# Patient Record
Sex: Female | Born: 1980 | Race: Black or African American | Hispanic: No | Marital: Married | Smoking: Current every day smoker
Health system: Southern US, Community
[De-identification: ages and names within clinical notes are randomized; demographics above are authoritative.]

## PROBLEM LIST (undated history)

## (undated) DIAGNOSIS — I10 Essential (primary) hypertension: Secondary | ICD-10-CM

## (undated) DIAGNOSIS — I82409 Acute embolism and thrombosis of unspecified deep veins of unspecified lower extremity: Secondary | ICD-10-CM

## (undated) HISTORY — PX: OTHER SURGICAL HISTORY: SHX169

---

## 2011-05-27 ENCOUNTER — Emergency Department (HOSPITAL_COMMUNITY)
Admission: EM | Admit: 2011-05-27 | Discharge: 2011-05-27 | Disposition: A | Discharge status: Home / Self Care | Payer: BC Managed Care – PPO | Attending: Emergency Medicine | Admitting: Emergency Medicine

## 2011-05-27 DIAGNOSIS — I10 Essential (primary) hypertension: Secondary | ICD-10-CM | POA: Insufficient documentation

## 2011-05-27 DIAGNOSIS — G43909 Migraine, unspecified, not intractable, without status migrainosus: Secondary | ICD-10-CM | POA: Insufficient documentation

## 2011-12-30 ENCOUNTER — Emergency Department (HOSPITAL_COMMUNITY)
Admission: EM | Admit: 2011-12-30 | Discharge: 2011-12-30 | Disposition: A | Discharge status: Home / Self Care | Payer: BC Managed Care – PPO | Attending: Emergency Medicine | Admitting: Emergency Medicine

## 2011-12-30 ENCOUNTER — Encounter (HOSPITAL_COMMUNITY): Payer: Self-pay | Admitting: Emergency Medicine

## 2011-12-30 DIAGNOSIS — I10 Essential (primary) hypertension: Secondary | ICD-10-CM | POA: Insufficient documentation

## 2011-12-30 DIAGNOSIS — R51 Headache: Secondary | ICD-10-CM | POA: Insufficient documentation

## 2011-12-30 HISTORY — DX: Essential (primary) hypertension: I10

## 2011-12-30 MED ORDER — METOCLOPRAMIDE HCL 5 MG/ML IJ SOLN
10.0000 mg | Freq: Once | INTRAMUSCULAR | Status: AC
  Administered 2011-12-30: 10 mg via INTRAVENOUS
  Filled 2011-12-30: qty 2

## 2011-12-30 MED ORDER — KETOROLAC TROMETHAMINE 30 MG/ML IJ SOLN
30.0000 mg | Freq: Once | INTRAMUSCULAR | Status: AC
  Administered 2011-12-30: 30 mg via INTRAMUSCULAR
  Filled 2011-12-30: qty 1

## 2011-12-30 NOTE — ED Notes (Signed)
Pt states she has a headache she cannot get rid of for the past 3 days  Pt describes as a nagging pain  Pt has nausea without vomiting  No hx of migraines but does have a history of HTN

## 2011-12-30 NOTE — ED Provider Notes (Signed)
Medical screening examination/treatment/procedure(s) were performed by non-physician practitioner and as supervising physician I was immediately available for consultation/collaboration.   Lyanne Co, MD 12/30/11 [phone]
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
## 2011-12-30 NOTE — Discharge Instructions (Signed)
General Headache, Without Cause A general headache has no specific cause. These headaches are not life-threatening. They will not lead to other types of headaches. HOME CARE   Make and keep follow-up visits with your doctor.   Only take medicine as told by your doctor.   Try to relax, get a massage, or use your thoughts to control your body (biofeedback).   Apply cold or heat to the head and neck. Apply 3 or 4 times a day or as needed.  Finding out the results of your test Ask when your test results will be ready. Make sure you get your test results. GET HELP RIGHT AWAY IF:   You have problems with medicine.   Your medicine does not help relieve pain.   Your headache changes or becomes worse.   You feel sick to your stomach (nauseous) or throw up (vomit).   You have a temperature by mouth above 102 F (38.9 C), not controlled by medicine.   Your have a stiff neck.   You have vision loss.   You have muscle weakness.   You lose control of your muscles.   You lose balance or have trouble walking.   You feel like you are going to pass out (faint).  MAKE SURE YOU:   Understand these instructions.   Will watch this condition.   Will get help right away if you are not doing well or get worse.  Document Released: 04/27/2008 Document Revised: 07/08/2011 Document Reviewed: 04/27/2008 [facility] Patient Information 2012 [place], [place]. Try to stay hydrated, and this season that can cause headache began years old.  Doses of ibuprofen or Tylenol for any residual headache, but at this time.  The headache has totally resolved

## 2012-01-03 ENCOUNTER — Other Ambulatory Visit: Payer: Self-pay | Admitting: Emergency Medicine

## 2012-01-03 DIAGNOSIS — N63 Unspecified lump in unspecified breast: Secondary | ICD-10-CM

## 2012-01-05 ENCOUNTER — Other Ambulatory Visit: Payer: BC Managed Care – PPO

## 2012-05-08 ENCOUNTER — Emergency Department (HOSPITAL_COMMUNITY)
Admission: EM | Admit: 2012-05-08 | Discharge: 2012-05-09 | Disposition: A | Discharge status: Home / Self Care | Payer: BC Managed Care – PPO | Attending: Emergency Medicine | Admitting: Emergency Medicine

## 2012-05-08 DIAGNOSIS — J45909 Unspecified asthma, uncomplicated: Secondary | ICD-10-CM | POA: Insufficient documentation

## 2012-05-08 DIAGNOSIS — R109 Unspecified abdominal pain: Secondary | ICD-10-CM | POA: Insufficient documentation

## 2012-05-08 DIAGNOSIS — Z882 Allergy status to sulfonamides status: Secondary | ICD-10-CM | POA: Insufficient documentation

## 2012-05-08 DIAGNOSIS — I10 Essential (primary) hypertension: Secondary | ICD-10-CM | POA: Insufficient documentation

## 2012-05-08 DIAGNOSIS — Z8249 Family history of ischemic heart disease and other diseases of the circulatory system: Secondary | ICD-10-CM | POA: Insufficient documentation

## 2012-05-08 DIAGNOSIS — Z809 Family history of malignant neoplasm, unspecified: Secondary | ICD-10-CM | POA: Insufficient documentation

## 2012-05-08 LAB — COMPREHENSIVE METABOLIC PANEL
ALT: 21 U/L (ref 0–35)
Calcium: 9.3 mg/dL (ref 8.4–10.5)
Creatinine, Ser: 0.83 mg/dL (ref 0.50–1.10)
GFR calc Af Amer: >90 mL/min (ref >90)
Glucose, Bld: 100 mg/dL — ABNORMAL HIGH (ref 70–99)
Sodium: 139 mEq/L (ref 135–145)
Total Protein: 6.9 g/dL (ref 6.0–8.3)

## 2012-05-08 LAB — CBC WITH DIFFERENTIAL/PLATELET
Basophils Absolute: 0 10*3/uL (ref 0.0–0.1)
Eosinophils Absolute: 0.3 10*3/uL (ref 0.0–0.7)
Eosinophils Relative: 4 % (ref 0–5)
Lymphs Abs: 3.7 10*3/uL (ref 0.7–4.0)
MCH: 31.5 pg (ref 26.0–34.0)
MCV: 91.7 fL (ref 78.0–100.0)
Monocytes Absolute: 0.3 10*3/uL (ref 0.1–1.0)
Platelets: 296 10*3/uL (ref 150–400)
RDW: 12.7 % (ref 11.5–15.5)

## 2012-05-08 LAB — URINALYSIS, ROUTINE W REFLEX MICROSCOPIC
Bilirubin Urine: NEGATIVE
Leukocytes, UA: NEGATIVE
Nitrite: NEGATIVE
Specific Gravity, Urine: 1.024 (ref 1.005–1.030)
Urobilinogen, UA: 0.2 mg/dL (ref 0.0–1.0)
pH: 5.5 (ref 5.0–8.0)

## 2012-05-08 NOTE — ED Notes (Signed)
Pt c/o abd pain that started at yesterday. Pt was seen today a urget care ([facility] [address]). Pt has N/V.pt sent for r/o appendicitis.VSS

## 2012-05-09 LAB — WET PREP, GENITAL
Trich, Wet Prep: NONE SEEN
Yeast Wet Prep HPF POC: NONE SEEN

## 2012-05-09 NOTE — ED Provider Notes (Signed)
History     CSN: [id number]  Arrival date & time 05/08/12  2014   First MD Initiated Contact with Patient 05/09/12 0046      Chief Complaint  Patient presents with  . Abdominal Pain    (Consider location/radiation/quality/duration/timing/severity/associated sxs/prior treatment) Patient is a 31 y.o. female presenting with abdominal pain. The history is provided by the patient.  Abdominal Pain The primary symptoms of the illness include abdominal pain and nausea. The primary symptoms of the illness do not include fever, shortness of breath, diarrhea, dysuria, vaginal discharge or vaginal bleeding.  Associated symptoms comments: Yesterday having lower abdominal pain associated with nausea and vomiting. No fever. She was eating yesterday without difficulty. Denies urinary symptoms, vaginal discharge or abnormal bleeding. She was seen at Urgent Care and sent here for further evaluation. .    Past Medical History  Diagnosis Date  . Hypertension   . Asthma     No past surgical history on file.  Family History  Problem Relation Age of Onset  . Cancer Mother   . Hypertension Father     History  Substance Use Topics  . Smoking status: Never Smoker   . Smokeless tobacco: Not on file  . Alcohol Use: No    OB History    Grav Para Term Preterm Abortions TAB SAB Ect Mult Living                  Review of Systems  Constitutional: Negative for fever.  Respiratory: Negative for shortness of breath.   Cardiovascular: Negative for chest pain.  Gastrointestinal: Positive for nausea and abdominal pain. Negative for diarrhea.  Genitourinary: Negative for dysuria, vaginal bleeding and vaginal discharge.    Allergies  Sulfa antibiotics  Home Medications   Current Outpatient Rx  Name Route Sig Dispense Refill  . ACETAMINOPHEN 500 MG PO TABS Oral Take 500 mg by mouth every 6 (six) hours as needed. Pain.    Marland Kitchen AMLODIPINE BESYLATE 10 MG PO TABS Oral Take 10 mg by mouth daily.       BP 136/79  Pulse 53  Temp 97.9 F (36.6 C) (Oral)  Resp 16  SpO2 99%  LMP 04/18/2012  Physical Exam  Constitutional: She is oriented to person, place, and time. She appears well-developed and well-nourished.  Neck: Normal range of motion.  Pulmonary/Chest: Effort normal.  Abdominal: Soft. Bowel sounds are normal. There is no rebound and no guarding.       Mild tenderness to lower abdomen bilaterally. No rebounding or guarding.   Genitourinary:       No cervical tenderness or discharge. No adnexal mass. Mild right adnexal tenderness.   Neurological: She is alert and oriented to person, place, and time.  Skin: Skin is warm and dry.  Psychiatric: She has a normal mood and affect.    ED Course  Procedures (including critical care time)  Labs Reviewed  CBC WITH DIFFERENTIAL - Abnormal; Notable for the following:    Neutrophils Relative 41 (*)     Lymphocytes Relative 51 (*)     All other components within normal limits  COMPREHENSIVE METABOLIC PANEL - Abnormal; Notable for the following:    Glucose, Bld 100 (*)     Total Bilirubin 0.2 (*)     All other components within normal limits  URINALYSIS, ROUTINE W REFLEX MICROSCOPIC - Abnormal; Notable for the following:    APPearance CLOUDY (*)     All other components within normal limits  WET PREP, GENITAL  GC/CHLAMYDIA PROBE AMP, GENITAL   No results found.   No diagnosis found. 1. Abdominal pain   MDM  The patient reports she is feeling much better without intervention. No further nausea. She has no fever, is maintaining normal diet, has no leukocytosis and an exam with bilateral tenderness. DDx appy vs. Ovarian cyst. Doubt appendicitis. Discussed findings, exam, the condition of appendicitis with the patient. She states that her husband needs to go to work and she would rather not stay for a CT scan. She agrees to return in 24 hours for a re-examination and understands the risk.        Rodena Medin,  PA-C 05/09/12 [phone]

## 2012-05-09 NOTE — ED Provider Notes (Signed)
Medical screening examination/treatment/procedure(s) were performed by non-physician practitioner and as supervising physician I was immediately available for consultation/collaboration.  Toy Baker, MD 05/09/12 [phone]

## 2012-10-02 ENCOUNTER — Emergency Department (HOSPITAL_COMMUNITY)
Admission: EM | Admit: 2012-10-02 | Discharge: 2012-10-02 | Disposition: A | Discharge status: Home / Self Care | Payer: BC Managed Care – PPO | Attending: Emergency Medicine | Admitting: Emergency Medicine

## 2012-10-02 ENCOUNTER — Encounter (HOSPITAL_COMMUNITY): Payer: Self-pay | Admitting: *Deleted

## 2012-10-02 DIAGNOSIS — M62838 Other muscle spasm: Secondary | ICD-10-CM | POA: Insufficient documentation

## 2012-10-02 DIAGNOSIS — I10 Essential (primary) hypertension: Secondary | ICD-10-CM | POA: Insufficient documentation

## 2012-10-02 DIAGNOSIS — X503XXA Overexertion from repetitive movements, initial encounter: Secondary | ICD-10-CM | POA: Insufficient documentation

## 2012-10-02 DIAGNOSIS — Y9389 Activity, other specified: Secondary | ICD-10-CM | POA: Insufficient documentation

## 2012-10-02 DIAGNOSIS — J45909 Unspecified asthma, uncomplicated: Secondary | ICD-10-CM | POA: Insufficient documentation

## 2012-10-02 DIAGNOSIS — Y929 Unspecified place or not applicable: Secondary | ICD-10-CM | POA: Insufficient documentation

## 2012-10-02 DIAGNOSIS — Z79899 Other long term (current) drug therapy: Secondary | ICD-10-CM | POA: Insufficient documentation

## 2012-10-02 DIAGNOSIS — IMO0002 Reserved for concepts with insufficient information to code with codable children: Secondary | ICD-10-CM | POA: Insufficient documentation

## 2012-10-02 DIAGNOSIS — M545 Low back pain: Secondary | ICD-10-CM

## 2012-10-02 MED ORDER — HYDROCODONE-ACETAMINOPHEN 5-325 MG PO TABS: 1.0000 | ORAL_TABLET | ORAL | Status: DC | PRN

## 2012-10-02 MED ORDER — CYCLOBENZAPRINE HCL 10 MG PO TABS
10.0000 mg | ORAL_TABLET | Freq: Once | ORAL | Status: AC
  Administered 2012-10-02: 10 mg via ORAL
  Filled 2012-10-02: qty 1

## 2012-10-02 MED ORDER — IBUPROFEN 800 MG PO TABS
800.0000 mg | ORAL_TABLET | Freq: Once | ORAL | Status: AC
  Administered 2012-10-02: 800 mg via ORAL
  Filled 2012-10-02: qty 1

## 2012-10-02 MED ORDER — IBUPROFEN 800 MG PO TABS: 800.0000 mg | ORAL_TABLET | Freq: Three times a day (TID) | ORAL | Status: DC

## 2012-10-02 MED ORDER — HYDROCODONE-ACETAMINOPHEN 5-325 MG PO TABS
1.0000 | ORAL_TABLET | Freq: Once | ORAL | Status: AC
  Administered 2012-10-02: 1 via ORAL
  Filled 2012-10-02: qty 1

## 2012-10-02 MED ORDER — CYCLOBENZAPRINE HCL 10 MG PO TABS: 10.0000 mg | ORAL_TABLET | Freq: Three times a day (TID) | ORAL | Status: DC | PRN

## 2012-10-02 NOTE — ED Provider Notes (Signed)
History    This chart was scribed for non-physician practitioner working with Lyanne Co, MD by Charolett Bumpers, ED Scribe. This patient was seen in room WTR8/WTR8 and the patient's care was started at 1517.   CSN: [id number]  Arrival date & time 10/02/12  1353   First MD Initiated Contact with Patient 10/02/12 1517      Chief Complaint  Patient presents with  . Back Pain  . Spasms    The history is provided by the patient. No language interpreter was used.   Patricia Gordon is a 32 y.o. female who presents to the Emergency Department complaining of persistent, moderate left lower back pain that radiates to her left leg with associated back spasms. She states that she has had mild back pain for a month after lifting heavy objects at work but no specific injury. She states that the pain worsened 2 days ago and describes the pain as sharp, grabbing pain that eases off. She denies any numbness, tingling, bowel or urinary incontinence, fevers, abdominal pain, difficulty urinating. She denies any h/o back pain.   Past Medical History  Diagnosis Date  . Hypertension   . Asthma     Past Surgical History  Procedure Laterality Date  . Boil removed      Family History  Problem Relation Age of Onset  . Cancer Mother   . Hypertension Father     History  Substance Use Topics  . Smoking status: Never Smoker   . Smokeless tobacco: Not on file  . Alcohol Use: No    OB History   Grav Para Term Preterm Abortions TAB SAB Ect Mult Living                  Review of Systems A complete 10 system review of systems was obtained and all systems are negative except as noted in the HPI and PMH.   Allergies  Sulfa antibiotics  Home Medications   Current Outpatient Rx  Name  Route  Sig  Dispense  Refill  . acetaminophen (TYLENOL) 500 MG tablet   Oral   Take 500 mg by mouth every 6 (six) hours as needed. Pain.         Marland Kitchen amLODipine (NORVASC) 10 MG tablet   Oral   Take 10  mg by mouth daily.         . lansoprazole (PREVACID) 30 MG capsule   Oral   Take 30 mg by mouth daily.           BP 125/82  Pulse 63  Temp(Src) 98.2 F (36.8 C) (Oral)  Resp 18  SpO2 100%  LMP 09/25/2012  Physical Exam  Nursing note and vitals reviewed. Constitutional: She is oriented to person, place, and time. She appears well-developed and well-nourished. No distress.  HENT:  Head: Normocephalic and atraumatic.  Eyes: EOM are normal. Pupils are equal, round, and reactive to light.  Neck: Normal range of motion. Neck supple. No tracheal deviation present.  Cardiovascular: Normal rate.   Pulmonary/Chest: Effort normal. No respiratory distress.  Abdominal: Soft. She exhibits no distension.  Musculoskeletal: Normal range of motion. She exhibits tenderness. She exhibits no edema.  Tender across left para lumbar spine. No midline lumbar tenderness. No swelling or discoloration lower back. Ambulatory with full weight bearing ability.  Neurological: She is alert and oriented to person, place, and time.  Skin: Skin is warm and dry.  Psychiatric: She has a normal mood and affect. Her behavior is  normal.    ED Course  Procedures (including critical care time)  DIAGNOSTIC STUDIES: Oxygen Saturation is 100% on room air, normal by my interpretation.    COORDINATION OF CARE:  15:36-Discussed planned course of treatment with the patient including rest and anti-inflammatories, who is agreeable at this time. Advised to f/u with orthopedic if pain persists.    Labs Reviewed - No data to display No results found.   No diagnosis found. 1. Low back pain 2. Muscle spasm   MDM  Uncomplicated muscular spasm without neurologic deficit.   I personally performed the services described in this documentation, which was scribed in my presence. The recorded information has been reviewed and is accurate.         Arnoldo Hooker, PA-C 10/02/12 1542

## 2012-10-02 NOTE — ED Notes (Signed)
Pt states yesterday started having lower back pain, w/ spasms, radiating down L leg.

## 2012-10-02 NOTE — ED Provider Notes (Signed)
Medical screening examination/treatment/procedure(s) were performed by non-physician practitioner and as supervising physician I was immediately available for consultation/collaboration.   Lyanne Co, MD 10/02/12 [phone]

## 2012-10-02 NOTE — ED Notes (Signed)
Pt has a ride home.  

## 2013-02-12 ENCOUNTER — Telehealth (INDEPENDENT_AMBULATORY_CARE_PROVIDER_SITE_OTHER): Payer: Self-pay

## 2013-02-12 ENCOUNTER — Other Ambulatory Visit (INDEPENDENT_AMBULATORY_CARE_PROVIDER_SITE_OTHER): Payer: Self-pay | Admitting: *Deleted

## 2013-02-12 ENCOUNTER — Encounter (HOSPITAL_COMMUNITY): Payer: Self-pay | Admitting: *Deleted

## 2013-02-12 ENCOUNTER — Emergency Department (HOSPITAL_COMMUNITY)
Admission: EM | Admit: 2013-02-12 | Discharge: 2013-02-12 | Disposition: A | Discharge status: Home / Self Care | Payer: BC Managed Care – PPO | Attending: Emergency Medicine | Admitting: Emergency Medicine

## 2013-02-12 ENCOUNTER — Other Ambulatory Visit (INDEPENDENT_AMBULATORY_CARE_PROVIDER_SITE_OTHER): Payer: Self-pay | Admitting: General Surgery

## 2013-02-12 DIAGNOSIS — N644 Mastodynia: Secondary | ICD-10-CM

## 2013-02-12 DIAGNOSIS — J45909 Unspecified asthma, uncomplicated: Secondary | ICD-10-CM | POA: Insufficient documentation

## 2013-02-12 DIAGNOSIS — Z79899 Other long term (current) drug therapy: Secondary | ICD-10-CM | POA: Insufficient documentation

## 2013-02-12 DIAGNOSIS — Z872 Personal history of diseases of the skin and subcutaneous tissue: Secondary | ICD-10-CM | POA: Insufficient documentation

## 2013-02-12 DIAGNOSIS — I10 Essential (primary) hypertension: Secondary | ICD-10-CM | POA: Insufficient documentation

## 2013-02-12 DIAGNOSIS — N611 Abscess of the breast and nipple: Secondary | ICD-10-CM

## 2013-02-12 DIAGNOSIS — N61 Mastitis without abscess: Secondary | ICD-10-CM | POA: Insufficient documentation

## 2013-02-12 DIAGNOSIS — F17200 Nicotine dependence, unspecified, uncomplicated: Secondary | ICD-10-CM | POA: Insufficient documentation

## 2013-02-12 MED ORDER — OXYCODONE-ACETAMINOPHEN 5-325 MG PO TABS: 1.0000 | ORAL_TABLET | ORAL | Status: DC | PRN

## 2013-02-12 MED ORDER — DOXYCYCLINE HYCLATE 100 MG PO CAPS: 100.0000 mg | ORAL_CAPSULE | Freq: Two times a day (BID) | ORAL | Status: DC

## 2013-02-12 MED ORDER — OXYCODONE-ACETAMINOPHEN 5-325 MG PO TABS
1.0000 | ORAL_TABLET | Freq: Once | ORAL | Status: AC
  Administered 2013-02-12: 1 via ORAL
  Filled 2013-02-12: qty 1

## 2013-02-12 MED ORDER — FLUCONAZOLE 150 MG PO TABS: 150.0000 mg | ORAL_TABLET | Freq: Once | ORAL | Status: DC

## 2013-02-12 MED ORDER — ONDANSETRON 4 MG PO TBDP
8.0000 mg | ORAL_TABLET | Freq: Once | ORAL | Status: AC
  Administered 2013-02-12: 8 mg via ORAL
  Filled 2013-02-12: qty 2

## 2013-02-12 NOTE — ED Notes (Signed)
Pt has abscess recurrent that is in her right nipple for a week. Pt states that they normally have to I&D it through consult to surgery. Pt states very painful to touch.

## 2013-02-12 NOTE — Consult Note (Signed)
Patricia Gordon [birth year]  [id number].   Primary Care MD: none Requesting MD: Dr. Brandt Loosen Chief Complaint/Reason for Consult: right breast pain HPI: this is a 32 yo black female who just recently moved here from [facility].  She has a history of having 3 prior abscesses on her right breast in the same spot over the last 1 year.  She has been followed by a surgeon in [facility] who has does several I&Ds and what sounds like a local excision of the inflamed area.  About a week ago, she developed some discomfort in this same spot similar to prior episodes.  She denies fevers or drainage at home.  She presented to the [facility] today for further evaluation.  She denies any trauma or injections into her breast.  We have been asked to see for further evaluation.  Review of Systems: Please see HPI, otherwise all other systems have been reviewed and are negative.  Family History  Problem Relation Age of Onset  . Cancer Mother   . Hypertension Father     Past Medical History  Diagnosis Date  . Hypertension   . Asthma     Past Surgical History  Procedure Laterality Date  . Boil removed      Social History:  reports that she has been smoking.  She does not have any smokeless tobacco history on file. She reports that she does not drink alcohol or use illicit drugs.  Allergies:  Allergies  Allergen Reactions  . Sulfa Antibiotics Nausea And Vomiting     (Not in a hospital admission)  Blood pressure 159/96, temperature 98.1 F (36.7 C), temperature source Oral, resp. rate 18, SpO2 99.00%. Physical Exam: General: pleasant, obese black female who is laying in bed in NAD HEENT: head is normocephalic, atraumatic.  Sclera are noninjected.  PERRL.  Ears and nose without any masses or lesions.  Mouth is pink and moist Heart: regular, rate, and rhythm.  Normal s1,s2. No obvious murmurs, gallops, or rubs noted.  Palpable radial and pedal pulses bilaterally Lungs: CTAB, no wheezes, rhonchi, or rales noted.   Respiratory effort nonlabored Breast: right breast with no obvious erythema, some slight warmth on the medical aspect of the areola.  No definite fluctuance or abscess visualized.  She does have some slight swelling of the medial portion of her nipple.  No fluid or drainage is able to be expressed.  This area is minimally tender.  She does have a scar present from around 2-4 oclock with some indentation from prior surgery.  Left breast with no abnormalities Abd: soft, NT, ND, +BS, no masses, hernias, or organomegaly Skin: warm and dry with no masses, lesions, or rashes Psych: A&Ox3 with an appropriate affect.    No results found for this or any previous visit (from the past 48 hour(s)). No results found.     Assessment/Plan 1. Right breast pain 2. H/o right breast abscesses x 3 3. HTN 4. Asthma  Plan: 1. I have d/w Dr. Carolynne Edouard.  The patient does not appear ill.  She is afebrile.  There is no definite sign of obvious abscess; however, she has a large amount of breast tissue and could have something deeper that is unable to be palpated.  We will arrange for outpatient imaging at the breast center for better visualization.  In the meantime, we will recommended 10 days of doxycycline along with warm compresses.  She is to call us or return to the ED if this gets any worse or she develops  fevers.  This has all been explained to the patient and she understands.  Patricia Gordon E 02/12/2013, 8:29 AM Pager: [phone]

## 2013-02-12 NOTE — ED Notes (Signed)
Surgical PA at bedside  

## 2013-02-12 NOTE — ED Provider Notes (Signed)
   History    CSN: [id number] Arrival date & time 02/12/13  0544  First MD Initiated Contact with Patient 02/12/13 [phone]     Chief Complaint  Patient presents with  . Abscess   (Consider location/radiation/quality/duration/timing/severity/associated sxs/prior Treatment) HPI  Patient is a generally healthy 32 yo woman with a history of recurrent abscesses of the right nipple. The patient has had 7 days of pain in the right nipple. She feels like she has a recurrent abscess. Her pain as 10 over 10, she is unable to describe the nature of the pain. The area is exquisitely tender. She denies fever. She denies pain over other breast tissue. Patient is not diabetic.  The patient had been followed by a general surgeon in [place]. However, she recently relocated to this area. Past Medical History  Diagnosis Date  . Hypertension   . Asthma    Past Surgical History  Procedure Laterality Date  . Boil removed     Family History  Problem Relation Age of Onset  . Cancer Mother   . Hypertension Father    History  Substance Use Topics  . Smoking status: Current Every Day Smoker  . Smokeless tobacco: Not on file  . Alcohol Use: No   OB History   Grav Para Term Preterm Abortions TAB SAB Ect Mult Living                 Review of Systems Gen: no weight loss, fevers, chills, night sweats Eyes: no discharge or drainage, no occular pain or visual changes Nose: no epistaxis or rhinorrhea Mouth: no dental pain, no sore throat Neck: no neck pain Lungs: no SOB, cough, wheezing CV: no chest pain, palpitations, dependent edema or orthopnea Abd: no abdominal pain, nausea, vomiting GU: no dysuria or gross hematuria MSK: no myalgias or arthralgias Neuro: no headache, no focal neurologic deficits Skin: no rash Psyche: negative.  Allergies  Sulfa antibiotics  Home Medications   Current Outpatient Rx  Name  Route  Sig  Dispense  Refill  . amLODipine (NORVASC) 10 MG tablet   Oral  Take 10 mg by mouth daily.          BP 159/96  Temp(Src) 98.1 F (36.7 C) (Oral)  Resp 18  SpO2 99% Physical Exam Gen: well developed and well nourished appearing Head: NCAT Eyes: PERL, EOMI Nose: no epistaixis or rhinorrhea Mouth/throat: mucosa is moist and pink Neck: supple, no stridor Lungs: CTA B, no wheezing, rhonchi or rales CV: RRR, no murmur Breasts: small abscess with fluctuance and exquisite ttp over the medial aspect of the right nipple, no extension of infectious/inflammatory process into the surrounding breast tissue is appreciated, no nipple, discharge, the nipple is not inverted, no axillary LAN, left breast and nipple are normal to inspection.  Abd: soft, obese, notender, nondistended Back: no ttp, no cva ttp Skin: no rashese, wnl Neuro: CN ii-xii grossly intact, no focal deficits Psyche; normal affect,  calm and cooperative.   ED Course  Procedures (including critical care time)  Case discussed with Dr. Jayme Cloud  MDM  Patient with right nipple abscess. Dr. Jayme Cloud of [facility] consulted and his team will consult on the patient in the ED.   Patient has been seen by [facility] PA. Recommendation is for doxycyline 100mg  po bid x 10d with analgesic. [facility] to arrange follow up a the [facility]. Patient counseled to return to the ED for worsening sx.   Brandt Loosen, MD 02/12/13 0830

## 2013-02-12 NOTE — ED Notes (Signed)
Patient states she has abscess developing x 1 week on right breast, denies drainage

## 2013-02-12 NOTE — ED Notes (Signed)
Pt alert and mentating appropriately upon d/c. Pt given d/c teaching, prescription, and follow up care instructions. Pt verbalizes understanding of d/c teaching and has no further questions upon d/c. Pt ambulatory upon d/c. Pt instructed not to drive. Pt endorses that she will not be driving home and ride is waiting for her. NAD noted upon d/c.

## 2013-02-12 NOTE — Telephone Encounter (Signed)
Called patient phone number has been disconnected, called patient emergency contact phone number which is also incorrect.  RE:  [facility] can see patient 02/13/13 @ 8:00 am.  Abigail Butts @ [phone] ext [phone] w/[facility] made her aware that we are unable to contact patient.

## 2013-02-15 ENCOUNTER — Telehealth (INDEPENDENT_AMBULATORY_CARE_PROVIDER_SITE_OTHER): Payer: Self-pay | Admitting: General Surgery

## 2013-02-15 NOTE — Telephone Encounter (Signed)
LMOM letting pt know that her [employer]GM and [place] were scheduled for 02/21/13 at 10:40.

## 2013-02-21 ENCOUNTER — Ambulatory Visit
Admission: RE | Admit: 2013-02-21 | Discharge: 2013-02-21 | Disposition: A | Discharge status: Home / Self Care | Payer: BC Managed Care – PPO | Source: Ambulatory Visit | Attending: General Surgery | Admitting: General Surgery

## 2013-02-21 DIAGNOSIS — N611 Abscess of the breast and nipple: Secondary | ICD-10-CM

## 2013-09-15 ENCOUNTER — Emergency Department (HOSPITAL_COMMUNITY): Payer: BC Managed Care – PPO

## 2013-09-15 ENCOUNTER — Encounter (HOSPITAL_COMMUNITY): Payer: Self-pay | Admitting: Emergency Medicine

## 2013-09-15 ENCOUNTER — Emergency Department (HOSPITAL_COMMUNITY)
Admission: EM | Admit: 2013-09-15 | Discharge: 2013-09-15 | Disposition: A | Discharge status: Home / Self Care | Payer: BC Managed Care – PPO | Attending: Emergency Medicine | Admitting: Emergency Medicine

## 2013-09-15 DIAGNOSIS — R0789 Other chest pain: Secondary | ICD-10-CM | POA: Insufficient documentation

## 2013-09-15 DIAGNOSIS — Z79899 Other long term (current) drug therapy: Secondary | ICD-10-CM | POA: Insufficient documentation

## 2013-09-15 DIAGNOSIS — IMO0002 Reserved for concepts with insufficient information to code with codable children: Secondary | ICD-10-CM | POA: Insufficient documentation

## 2013-09-15 DIAGNOSIS — F17200 Nicotine dependence, unspecified, uncomplicated: Secondary | ICD-10-CM | POA: Insufficient documentation

## 2013-09-15 DIAGNOSIS — I10 Essential (primary) hypertension: Secondary | ICD-10-CM | POA: Insufficient documentation

## 2013-09-15 DIAGNOSIS — Z3202 Encounter for pregnancy test, result negative: Secondary | ICD-10-CM | POA: Insufficient documentation

## 2013-09-15 DIAGNOSIS — R079 Chest pain, unspecified: Secondary | ICD-10-CM

## 2013-09-15 DIAGNOSIS — Z792 Long term (current) use of antibiotics: Secondary | ICD-10-CM | POA: Insufficient documentation

## 2013-09-15 DIAGNOSIS — J45909 Unspecified asthma, uncomplicated: Secondary | ICD-10-CM | POA: Insufficient documentation

## 2013-09-15 DIAGNOSIS — Z86718 Personal history of other venous thrombosis and embolism: Secondary | ICD-10-CM | POA: Insufficient documentation

## 2013-09-15 HISTORY — DX: Acute embolism and thrombosis of unspecified deep veins of unspecified lower extremity: I82.409

## 2013-09-15 LAB — CBC
HCT: 40.8 % (ref 36.0–46.0)
Hemoglobin: 14.3 g/dL (ref 12.0–15.0)
MCH: 33.2 pg (ref 26.0–34.0)
MCHC: 35 g/dL (ref 30.0–36.0)
MCV: 94.7 fL (ref 78.0–100.0)
PLATELETS: 287 10*3/uL (ref 150–400)
RBC: 4.31 MIL/uL (ref 3.87–5.11)
RDW: 12.9 % (ref 11.5–15.5)
WBC: 5.9 10*3/uL (ref 4.0–10.5)

## 2013-09-15 LAB — COMPREHENSIVE METABOLIC PANEL
ALBUMIN: 3.8 g/dL (ref 3.5–5.2)
ALT: 23 U/L (ref 0–35)
AST: 18 U/L (ref 0–37)
Alkaline Phosphatase: 59 U/L (ref 39–117)
BUN: 8 mg/dL (ref 6–23)
CALCIUM: 9.1 mg/dL (ref 8.4–10.5)
CO2: 25 mEq/L (ref 19–32)
CREATININE: 0.82 mg/dL (ref 0.50–1.10)
Chloride: 104 mEq/L (ref 96–112)
GFR calc Af Amer: >90 mL/min (ref >90)
GFR calc non Af Amer: >90 mL/min (ref >90)
Glucose, Bld: 83 mg/dL (ref 70–99)
Potassium: 4.2 mEq/L (ref 3.7–5.3)
Sodium: 140 mEq/L (ref 137–147)
TOTAL PROTEIN: 7.1 g/dL (ref 6.0–8.3)
Total Bilirubin: 0.2 mg/dL — ABNORMAL LOW (ref 0.3–1.2)

## 2013-09-15 LAB — POCT PREGNANCY, URINE: Preg Test, Ur: NEGATIVE

## 2013-09-15 LAB — POCT I-STAT TROPONIN I: Troponin i, poc: 0 ng/mL (ref 0.00–0.08)

## 2013-09-15 LAB — PROTIME-INR
INR: 1.28 (ref 0.00–1.49)
PROTHROMBIN TIME: 15.7 s — AB (ref 11.6–15.2)

## 2013-09-15 LAB — TROPONIN I

## 2013-09-15 MED ORDER — IOHEXOL 350 MG/ML SOLN
100.0000 mL | Freq: Once | INTRAVENOUS | Status: AC | PRN
  Administered 2013-09-15: 100 mL via INTRAVENOUS

## 2013-09-15 MED ORDER — ASPIRIN 325 MG PO TABS
325.0000 mg | ORAL_TABLET | ORAL | Status: AC
Start: 2013-09-15 — End: 2013-09-15
  Administered 2013-09-15: 325 mg via ORAL
  Filled 2013-09-15: qty 1

## 2013-09-15 MED ORDER — NITROGLYCERIN 0.4 MG SL SUBL: 0.4000 mg | SUBLINGUAL_TABLET | SUBLINGUAL | Status: DC | PRN

## 2013-09-15 MED ORDER — AMLODIPINE BESYLATE 5 MG PO TABS
5.0000 mg | ORAL_TABLET | Freq: Once | ORAL | Status: AC
  Administered 2013-09-15: 5 mg via ORAL
  Filled 2013-09-15: qty 1

## 2013-09-15 NOTE — ED Provider Notes (Signed)
CSN: [id number]     Arrival date & time 09/15/13  0813 History   First MD Initiated Contact with Patient 09/15/13 0901     Chief Complaint  Patient presents with  . Chest Pain     (Consider location/radiation/quality/duration/timing/severity/associated sxs/prior Treatment) Patient is a 33 y.o. female presenting with chest pain. The history is provided by the patient. No language interpreter was used.  Chest Pain Pain location:  Substernal area Pain quality: aching and sharp   Pain radiates to:  R shoulder Pain radiates to the back: no   Pain severity:  Mild Onset quality:  Sudden Duration:  3 hours Timing:  Constant Progression:  Worsening Chronicity:  New Context: breathing, movement and stress   Context: no drug use   Relieved by:  Nothing Worsened by:  Nothing tried Ineffective treatments:  None tried Associated symptoms: no abdominal pain and no numbness   Risk factors: no aortic disease and no surgery   Pt has pain in right chest that started today.   Pt reports pain is sharp.  No shortness of   Past Medical History  Diagnosis Date  . Hypertension   . Asthma   . DVT of leg (deep venous thrombosis)     RT leg 2001   Past Surgical History  Procedure Laterality Date  . Boil removed     Family History  Problem Relation Age of Onset  . Cancer Mother   . Hypertension Father    History  Substance Use Topics  . Smoking status: Current Every Day Smoker -- 0.50 packs/day  . Smokeless tobacco: Not on file  . Alcohol Use: No   OB History   Grav Para Term Preterm Abortions TAB SAB Ect Mult Living                 Review of Systems  Cardiovascular: Positive for chest pain.  Gastrointestinal: Negative for abdominal pain.  Neurological: Negative for numbness.  All other systems reviewed and are negative.      Allergies  Sulfa antibiotics  Home Medications   Current Outpatient Rx  Name  Route  Sig  Dispense  Refill  . amLODipine (NORVASC) 10 MG tablet  Oral   Take 10 mg by mouth daily.         Marland Kitchen doxycycline (VIBRAMYCIN) 100 MG capsule   Oral   Take 1 capsule (100 mg total) by mouth 2 (two) times daily. One po bid x 7 days   20 capsule   0   . fluconazole (DIFLUCAN) 150 MG tablet   Oral   Take 1 tablet (150 mg total) by mouth once.   1 tablet   0   . oxyCODONE-acetaminophen (PERCOCET) 5-325 MG per tablet   Oral   Take 1 tablet by mouth every 4 (four) hours as needed for pain.   15 tablet   0    BP 139/81  Pulse 71  Temp(Src) 99 F (37.2 C) (Oral)  Resp 20  SpO2 100%  LMP 08/22/2013 Physical Exam  Nursing note and vitals reviewed. Constitutional: She is oriented to person, place, and time. She appears well-developed and well-nourished.  HENT:  Head: Normocephalic and atraumatic.  Right Ear: External ear normal.  Left Ear: External ear normal.  Nose: Nose normal.  Mouth/Throat: Oropharynx is clear and moist.  Eyes: Pupils are equal, round, and reactive to light.  Neck: Normal range of motion. Neck supple.  Cardiovascular: Normal rate, regular rhythm and normal heart sounds.   Pulmonary/Chest: Effort  normal and breath sounds normal.  Abdominal: Soft. Bowel sounds are normal.  Musculoskeletal: Normal range of motion.  Neurological: She is alert and oriented to person, place, and time. She has normal reflexes.  Skin: Skin is warm.  Psychiatric: She has a normal mood and affect.    ED Course  Procedures (including critical care time) Labs Review Labs Reviewed  CBC  PROTIME-INR  COMPREHENSIVE METABOLIC PANEL   Imaging Review No results found.  EKG Interpretation   None      Results for orders placed during the hospital encounter of 09/15/13  CBC      Result Value Ref Range   WBC 5.9  4.0 - 10.5 K/uL   RBC 4.31  3.87 - 5.11 MIL/uL   Hemoglobin 14.3  12.0 - 15.0 g/dL   HCT 81.140.8  91.436.0 - 78.246.0 %   MCV 94.7  78.0 - 100.0 fL   MCH 33.2  26.0 - 34.0 pg   MCHC 35.0  30.0 - 36.0 g/dL   RDW 95.612.9  21.311.5 -  08.615.5 %   Platelets 287  150 - 400 K/uL  PROTIME-INR      Result Value Ref Range   Prothrombin Time 15.7 (*) 11.6 - 15.2 seconds   INR 1.28  0.00 - 1.49  COMPREHENSIVE METABOLIC PANEL      Result Value Ref Range   Sodium 140  137 - 147 mEq/L   Potassium 4.2  3.7 - 5.3 mEq/L   Chloride 104  96 - 112 mEq/L   CO2 25  19 - 32 mEq/L   Glucose, Bld 83  70 - 99 mg/dL   BUN 8  6 - 23 mg/dL   Creatinine, Ser 5.780.82  0.50 - 1.10 mg/dL   Calcium 9.1  8.4 - 46.910.5 mg/dL   Total Protein 7.1  6.0 - 8.3 g/dL   Albumin 3.8  3.5 - 5.2 g/dL   AST 18  0 - 37 U/L   ALT 23  0 - 35 U/L   Alkaline Phosphatase 59  39 - 117 U/L   Total Bilirubin 0.2 (*) 0.3 - 1.2 mg/dL   GFR calc non Af Amer >90  >90 mL/min   GFR calc Af Amer >90  >90 mL/min  TROPONIN I      Result Value Ref Range   Troponin I <0.30  <0.30 ng/mL  POCT I-STAT TROPONIN I      Result Value Ref Range   Troponin i, poc 0.00  0.00 - 0.08 ng/mL   Comment 3           POCT PREGNANCY, URINE      Result Value Ref Range   Preg Test, Ur NEGATIVE  NEGATIVE   Ct Angio Chest Pe W/cm &/or Wo Cm  09/15/2013   CLINICAL DATA:  Chest pain.  EXAM: CT ANGIOGRAPHY CHEST WITH CONTRAST  TECHNIQUE: Multidetector CT imaging of the chest was performed using the standard protocol during bolus administration of intravenous contrast. Multiplanar CT image reconstructions and MIPs were obtained to evaluate the vascular anatomy.  CONTRAST:  100 mL OMNIPAQUE IOHEXOL 350 MG/ML SOLN  COMPARISON:  Single view of the chest earlier this same day.  FINDINGS: No pulmonary embolus is identified. Heart size is mildly enlarged. No pleural or pericardial effusion. There are some calcified left hilar lymph nodes consistent with old granulomatous disease. No pleural or pericardial effusion is identified. The lungs demonstrate mild dependent atelectasis. Visualized upper abdomen is unremarkable. No focal bony abnormality is identified.  Review of the MIP images confirms the above findings.   IMPRESSION: Negative for pulmonary embolus or acute disease.  Cardiomegaly.  Calcified left hilar lymph nodes are consistent with old granulomatous disease.   Electronically Signed   By: Drusilla Kanner M.D.   On: 09/15/2013 10:35   Dg Chest Port 1 View  09/15/2013   CLINICAL DATA:  Chest pain.  EXAM: PORTABLE CHEST - 1 VIEW  COMPARISON:  None.  FINDINGS: External pacing pads are present overlying the left chest. The heart is mildly enlarged. There is no evidence of pulmonary edema, consolidation, pneumothorax, nodule or pleural fluid.  IMPRESSION: Mild cardiomegaly.  No overt edema.   Electronically Signed   By: Irish Lack M.D.   On: 09/15/2013 09:26     MDM Pt reports increased feelings of stress.   Labs normal.   Ct no pe.   Pt advised to follow up with her Md for recheck next week.      Final diagnoses:  Chest pain        Elson Areas, PA-C 09/15/13 1314

## 2013-09-15 NOTE — Discharge Instructions (Signed)
Chest Pain (Nonspecific) °It is often hard to give a specific diagnosis for the cause of chest pain. There is always a chance that your pain could be related to something serious, such as a heart attack or a blood clot in the lungs. You need to follow up with your caregiver for further evaluation. °CAUSES  °· Heartburn. °· Pneumonia or bronchitis. °· Anxiety or stress. °· Inflammation around your heart (pericarditis) or lung (pleuritis or pleurisy). °· A blood clot in the lung. °· A collapsed lung (pneumothorax). It can develop suddenly on its own (spontaneous pneumothorax) or from injury (trauma) to the chest. °· Shingles infection (herpes zoster virus). °The chest wall is composed of bones, muscles, and cartilage. Any of these can be the source of the pain. °· The bones can be bruised by injury. °· The muscles or cartilage can be strained by coughing or overwork. °· The cartilage can be affected by inflammation and become sore (costochondritis). °DIAGNOSIS  °Lab tests or other studies, such as X-rays, electrocardiography, stress testing, or cardiac imaging, may be needed to find the cause of your pain.  °TREATMENT  °· Treatment depends on what may be causing your chest pain. Treatment may include: °· Acid blockers for heartburn. °· Anti-inflammatory medicine. °· Pain medicine for inflammatory conditions. °· Antibiotics if an infection is present. °· You may be advised to change lifestyle habits. This includes stopping smoking and avoiding alcohol, caffeine, and chocolate. °· You may be advised to keep your head raised (elevated) when sleeping. This reduces the chance of acid going backward from your stomach into your esophagus. °· Most of the time, nonspecific chest pain will improve within 2 to 3 days with rest and mild pain medicine. °HOME CARE INSTRUCTIONS  °· If antibiotics were prescribed, take your antibiotics as directed. Finish them even if you start to feel better. °· For the next few days, avoid physical  activities that bring on chest pain. Continue physical activities as directed. °· Do not smoke. °· Avoid drinking alcohol. °· Only take over-the-counter or prescription medicine for pain, discomfort, or fever as directed by your caregiver. °· Follow your caregiver's suggestions for further testing if your chest pain does not go away. °· Keep any follow-up appointments you made. If you do not go to an appointment, you could develop lasting (chronic) problems with pain. If there is any problem keeping an appointment, you must call to reschedule. °SEEK MEDICAL CARE IF:  °· You think you are having problems from the medicine you are taking. Read your medicine instructions carefully. °· Your chest pain does not go away, even after treatment. °· You develop a rash with blisters on your chest. °SEEK IMMEDIATE MEDICAL CARE IF:  °· You have increased chest pain or pain that spreads to your arm, neck, jaw, back, or abdomen. °· You develop shortness of breath, an increasing cough, or you are coughing up blood. °· You have severe back or abdominal pain, feel nauseous, or vomit. °· You develop severe weakness, fainting, or chills. °· You have a fever. °THIS IS AN EMERGENCY. Do not wait to see if the pain will go away. Get medical help at once. Call your local emergency services (911 in [place] Do not drive yourself to the hospital. °MAKE SURE YOU:  °· Understand these instructions. °· Will watch your condition. °· Will get help right away if you are not doing well or get worse. °Document Released: 04/28/2005 Document Revised: 10/11/2011 Document Reviewed: 02/22/2008 °[employer]® Patient Information ©2014 [employer]   [employer]. ° °

## 2013-09-15 NOTE — ED Provider Notes (Signed)
Medical screening examination/treatment/procedure(s) were performed by non-physician practitioner and as supervising physician I was immediately available for consultation/collaboration.  EKG Interpretation    Date/Time:  Saturday September 15 2013 08:16:53 EST Ventricular Rate:  69 PR Interval:  138 QRS Duration: 76 QT Interval:  376 QTC Calculation: 402 R Axis:   73 Text Interpretation:  Normal sinus rhythm Cannot rule out Anterior infarct , age undetermined T wave abnormality, consider inferior ischemia Abnormal ECG Confirmed by Rubin PayorPICKERING  MD, Josyah Achor ([id number]) on 09/15/2013 1:10:25 PM             Juliet RudeNathan R. Rubin PayorPickering, MD 09/15/13 1527

## 2013-09-15 NOTE — ED Notes (Signed)
PT reports CP started this AM while at work. Pain in dull with intermitted sharp feeling. Pt reports pain radiated into RT shoulder.[place] HX of DVT in 2001 . DVT RT leg.

## 2013-09-20 ENCOUNTER — Ambulatory Visit: Payer: Self-pay | Admitting: Podiatrist

## 2015-03-10 IMAGING — CR DG CHEST 1V PORT
1 series · 1 of 1 positions shown · non-contrast
Comparison: None.

CLINICAL DATA: Chest pain.

EXAM:
PORTABLE CHEST - 1 VIEW

[AP]
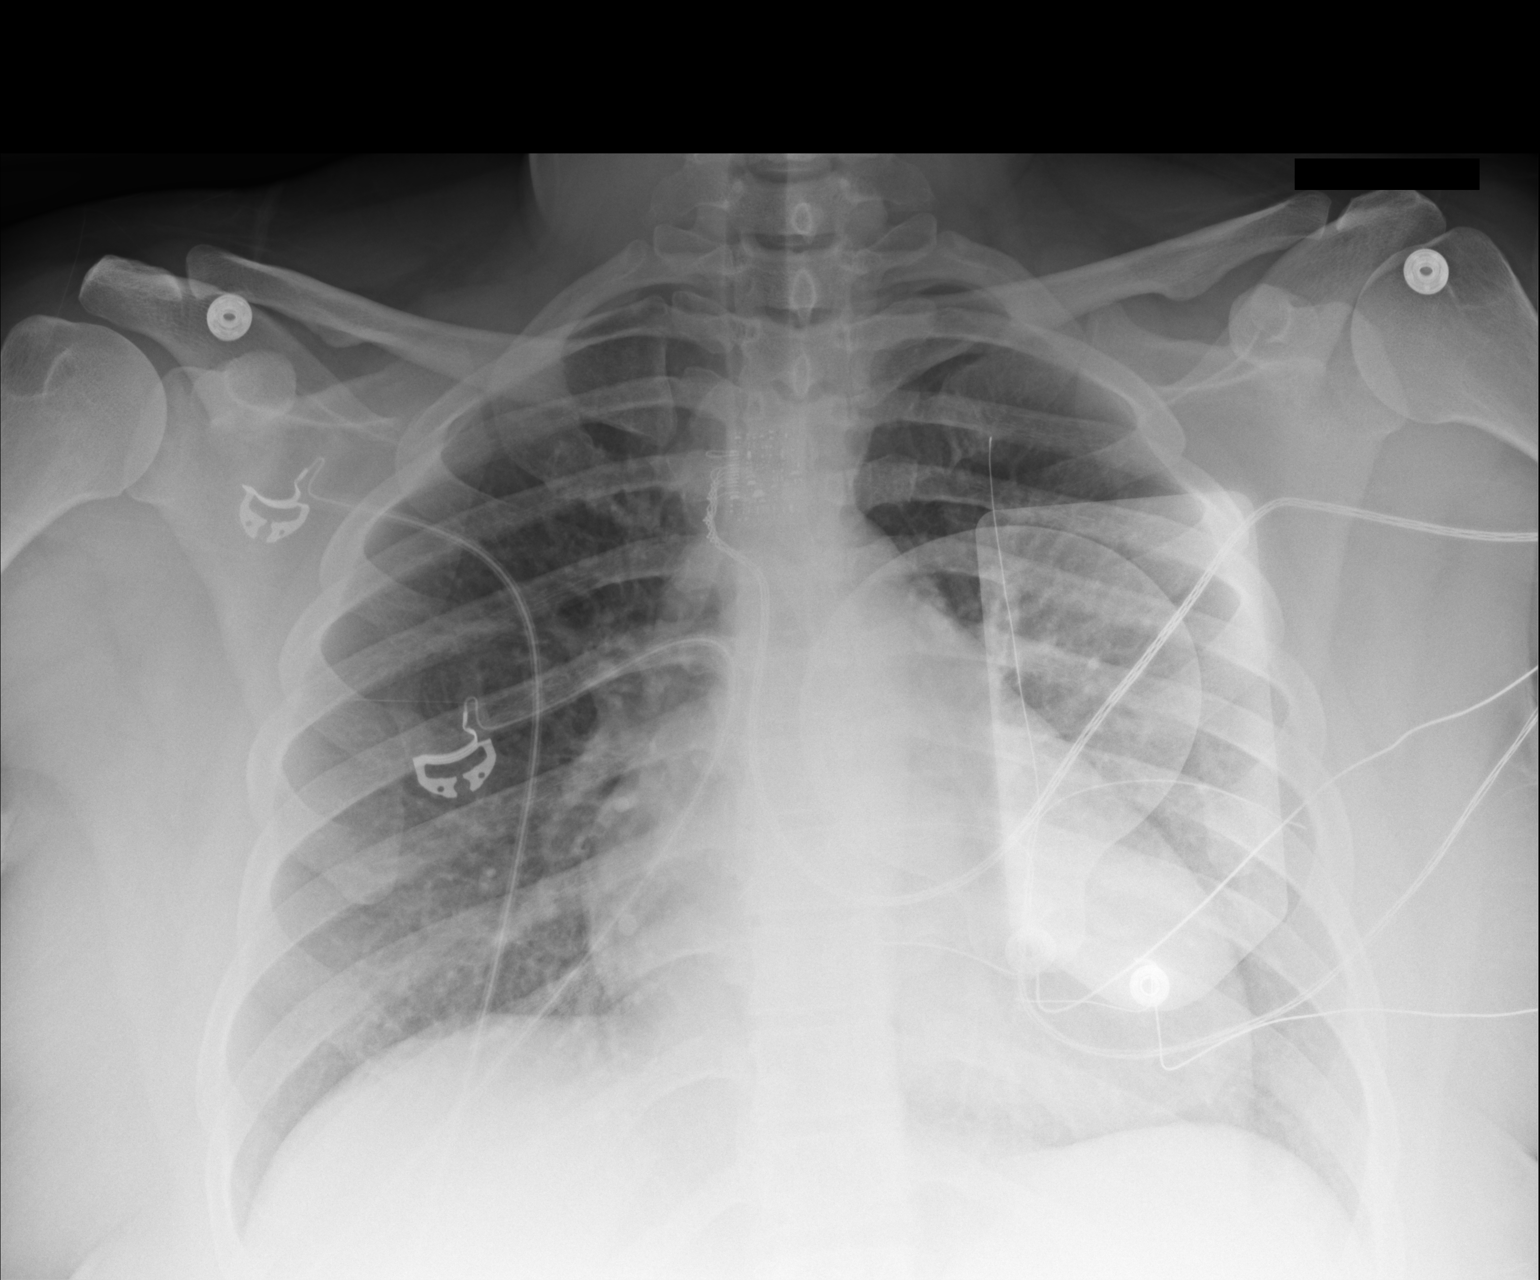

[1 of 1 positions shown; findings below may reference images not displayed]

FINDINGS: External pacing pads are present overlying the left chest. The heart
is mildly enlarged. There is no evidence of pulmonary edema,
consolidation, pneumothorax, nodule or pleural fluid.
IMPRESSION: Mild cardiomegaly.  No overt edema.
